# Patient Record
Sex: Male | Born: 2000 | Race: White | Hispanic: No | Marital: Single | Smoking: Current every day smoker
Health system: Southern US, Community
[De-identification: ages and names within clinical notes are randomized; demographics above are authoritative.]

---
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
## 2019-06-06 ENCOUNTER — Emergency Department (HOSPITAL_COMMUNITY)
Admission: EM | Admit: 2019-06-06 | Discharge: 2019-06-06 | Discharge status: Against Medical Advice | Payer: Medicaid Other

## 2019-06-06 NOTE — ED Notes (Signed)
PT is sitting with another patient who has been waiting for sometime now.

## 2019-06-06 NOTE — ED Notes (Signed)
PT seen leaqving with another patient who stated they are leaving

## 2019-06-16 ENCOUNTER — Encounter: Payer: Self-pay | Admitting: Emergency Medicine

## 2019-06-16 ENCOUNTER — Other Ambulatory Visit: Payer: Self-pay

## 2019-06-16 ENCOUNTER — Emergency Department
Admission: EM | Admit: 2019-06-16 | Discharge: 2019-06-16 | Disposition: A | Discharge status: Home / Self Care | Payer: Medicaid Other | Attending: Emergency Medicine | Admitting: Emergency Medicine

## 2019-06-16 DIAGNOSIS — F17200 Nicotine dependence, unspecified, uncomplicated: Secondary | ICD-10-CM | POA: Insufficient documentation

## 2019-06-16 DIAGNOSIS — F909 Attention-deficit hyperactivity disorder, unspecified type: Secondary | ICD-10-CM | POA: Diagnosis not present

## 2019-06-16 DIAGNOSIS — Z202 Contact with and (suspected) exposure to infections with a predominantly sexual mode of transmission: Secondary | ICD-10-CM | POA: Insufficient documentation

## 2019-06-16 DIAGNOSIS — Z79899 Other long term (current) drug therapy: Secondary | ICD-10-CM | POA: Insufficient documentation

## 2019-06-16 MED ORDER — LIDOCAINE HCL (PF) 1 % IJ SOLN
2.1000 mL | Freq: Once | INTRAMUSCULAR | Status: DC
  Filled 2019-06-16: qty 5

## 2019-06-16 MED ORDER — AZITHROMYCIN 500 MG PO TABS
1000.0000 mg | ORAL_TABLET | Freq: Once | ORAL | Status: AC
  Administered 2019-06-16: 1000 mg via ORAL
  Filled 2019-06-16: qty 2

## 2019-06-16 MED ORDER — CEFTRIAXONE SODIUM 250 MG IJ SOLR
250.0000 mg | Freq: Once | INTRAMUSCULAR | Status: DC
  Filled 2019-06-16: qty 250
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
## 2019-06-16 NOTE — Discharge Instructions (Signed)
Follow up with the health department in about 2 weeks.  Return to the ER for symptoms of concern if unable to schedule an appointment.
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
## 2019-07-24 ENCOUNTER — Emergency Department: Payer: Medicaid Other

## 2019-07-24 ENCOUNTER — Emergency Department
Admission: EM | Admit: 2019-07-24 | Discharge: 2019-07-24 | Disposition: A | Discharge status: Home / Self Care | Payer: Medicaid Other | Attending: Student in an Organized Health Care Education/Training Program | Admitting: Student in an Organized Health Care Education/Training Program

## 2019-07-24 ENCOUNTER — Other Ambulatory Visit: Payer: Self-pay

## 2019-07-24 DIAGNOSIS — S0990XA Unspecified injury of head, initial encounter: Secondary | ICD-10-CM | POA: Insufficient documentation

## 2019-07-24 DIAGNOSIS — Y929 Unspecified place or not applicable: Secondary | ICD-10-CM | POA: Diagnosis not present

## 2019-07-24 DIAGNOSIS — M7918 Myalgia, other site: Secondary | ICD-10-CM | POA: Insufficient documentation

## 2019-07-24 DIAGNOSIS — W1839XA Other fall on same level, initial encounter: Secondary | ICD-10-CM | POA: Insufficient documentation

## 2019-07-24 DIAGNOSIS — Y939 Activity, unspecified: Secondary | ICD-10-CM | POA: Diagnosis not present

## 2019-07-24 DIAGNOSIS — S060X9A Concussion with loss of consciousness of unspecified duration, initial encounter: Secondary | ICD-10-CM | POA: Insufficient documentation

## 2019-07-24 DIAGNOSIS — F17210 Nicotine dependence, cigarettes, uncomplicated: Secondary | ICD-10-CM | POA: Insufficient documentation

## 2019-07-24 DIAGNOSIS — Z79899 Other long term (current) drug therapy: Secondary | ICD-10-CM | POA: Diagnosis not present

## 2019-07-24 DIAGNOSIS — Y999 Unspecified external cause status: Secondary | ICD-10-CM | POA: Diagnosis not present

## 2019-07-24 DIAGNOSIS — S0083XA Contusion of other part of head, initial encounter: Secondary | ICD-10-CM | POA: Insufficient documentation

## 2019-07-24 DIAGNOSIS — S0993XA Unspecified injury of face, initial encounter: Secondary | ICD-10-CM | POA: Diagnosis present

## 2019-07-24 MED ORDER — FLUORESCEIN SODIUM 1 MG OP STRP
1.0000 | ORAL_STRIP | Freq: Once | OPHTHALMIC | Status: DC
  Filled 2019-07-24: qty 1

## 2019-07-24 MED ORDER — HYDROCODONE-ACETAMINOPHEN 5-325 MG PO TABS: 1.0000 | ORAL_TABLET | ORAL | 0 refills | Status: DC | PRN

## 2019-07-24 MED ORDER — HYDROCODONE-ACETAMINOPHEN 5-325 MG PO TABS
1.0000 | ORAL_TABLET | Freq: Once | ORAL | Status: AC
  Administered 2019-07-24: 1 via ORAL
  Filled 2019-07-24: qty 1
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
## 2020-01-31 ENCOUNTER — Emergency Department (HOSPITAL_BASED_OUTPATIENT_CLINIC_OR_DEPARTMENT_OTHER)
Admission: EM | Admit: 2020-01-31 | Discharge: 2020-01-31 | Disposition: A | Discharge status: Home / Self Care | Payer: Medicaid Other | Attending: Emergency Medicine | Admitting: Emergency Medicine

## 2020-01-31 ENCOUNTER — Encounter (HOSPITAL_BASED_OUTPATIENT_CLINIC_OR_DEPARTMENT_OTHER): Payer: Self-pay

## 2020-01-31 ENCOUNTER — Other Ambulatory Visit: Payer: Self-pay

## 2020-01-31 DIAGNOSIS — Z202 Contact with and (suspected) exposure to infections with a predominantly sexual mode of transmission: Secondary | ICD-10-CM | POA: Diagnosis present

## 2020-01-31 DIAGNOSIS — F17210 Nicotine dependence, cigarettes, uncomplicated: Secondary | ICD-10-CM | POA: Diagnosis not present

## 2020-01-31 MED ORDER — DOXYCYCLINE HYCLATE 100 MG PO TABS
100.0000 mg | ORAL_TABLET | Freq: Once | ORAL | Status: AC
  Administered 2020-01-31: 13:00:00 100 mg via ORAL
  Filled 2020-01-31: qty 1

## 2020-01-31 MED ORDER — CEFTRIAXONE SODIUM 500 MG IJ SOLR
500.0000 mg | Freq: Once | INTRAMUSCULAR | Status: AC
  Administered 2020-01-31: 500 mg via INTRAMUSCULAR
  Filled 2020-01-31: qty 500

## 2020-01-31 MED ORDER — DOXYCYCLINE HYCLATE 100 MG PO CAPS: 100.0000 mg | ORAL_CAPSULE | Freq: Two times a day (BID) | ORAL | 0 refills | Status: AC

## 2020-01-31 NOTE — ED Provider Notes (Signed)
Emergency Department Provider Note   I have reviewed the triage vital signs and the nursing notes.   HISTORY  Chief Complaint Exposure to STD   HPI Oscar Downs is a 19 y.o. male with past medical history reviewed below presents to the emergency department after known exposure to sexually transmitted disease.  Patient states that he was exposed to gonorrhea after his partner tested positive.  They have been treated but he was told that he would need to be treated as well.  He does not want HIV or syphilis testing today although this was discussed and offered.  He is not experiencing urethral discharge, dysuria, or other discomfort.  No fevers.   Past Medical History:  Diagnosis Date  . Attention deficit disorder     There are no problems to display for this patient.   History reviewed. No pertinent surgical history.  Allergies Patient has no known allergies.  No family history on file.  Social History Social History   Tobacco Use  . Smoking status: Current Every Day Smoker    Types: Cigarettes  . Smokeless tobacco: Never Used  Substance Use Topics  . Alcohol use: Yes    Comment: occ  . Drug use: No    Review of Systems  Constitutional: No fever/chills Gastrointestinal: No abdominal pain.   Genitourinary: Negative for dysuria or urethral discharge.   ____________________________________________   PHYSICAL EXAM:  VITAL SIGNS: ED Triage Vitals  Enc Vitals Group     BP 01/31/20 1209 130/70     Pulse Rate 01/31/20 1209 78     Resp 01/31/20 1209 16     Temp 01/31/20 1209 98.1 F (36.7 C)     Temp Source 01/31/20 1209 Oral     SpO2 01/31/20 1209 100 %     Weight 01/31/20 1206 155 lb (70.3 kg)     Height 01/31/20 1206 5\' 6"  (1.676 m)   Constitutional: Alert and oriented. Well appearing and in no acute distress. Eyes: Conjunctivae are normal.  Head: Atraumatic. Nose: No congestion/rhinnorhea. Mouth/Throat: Mucous membranes are moist. Neck: No  stridor.  Cardiovascular: Normal rate, regular rhythm. Respiratory: Normal respiratory effort.  Gastrointestinal: No distention.  Musculoskeletal: No gross deformities of extremities. Neurologic:  Normal speech and language.  Skin:  Skin is warm, dry and intact. No rash noted.  ____________________________________________   LABS (all labs ordered are listed, but only abnormal results are displayed)  Labs Reviewed  GC/CHLAMYDIA PROBE AMP ([place]) NOT AT [facility]   ____________________________________________   PROCEDURES  Procedure(s) performed:   Procedures  None  ____________________________________________   INITIAL IMPRESSION / ASSESSMENT AND PLAN / ED COURSE  Pertinent labs & imaging results that were available during my care of the patient were reviewed by me and considered in my medical decision making (see chart for details).   Patient presents to the emergency department for evaluation of STD exposure.  He believes he was exposed to gonorrhea.  Plan for treatment of both gonorrhea and chlamydia.  Patient to provide a urine sample.  He is not experiencing dysuria, hesitancy, urgency.  No urethral discharge.  GU exam deferred.  Patient does not want HIV or syphilis testing which was encouraged here.  We will give Rocephin once and doxycycline for the next week.  Patient advised that until his antibiotics are complete he could pass infection to others and needs to remain abstinent or use barrier protection. Patient will give dirty urine for STD screening.    ____________________________________________  FINAL CLINICAL  IMPRESSION(S) / ED DIAGNOSES  Final diagnoses:  STD exposure     MEDICATIONS GIVEN DURING THIS VISIT:  Medications  cefTRIAXone (ROCEPHIN) injection 500 mg (has no administration in time range)  doxycycline (VIBRA-TABS) tablet 100 mg (has no administration in time range)     NEW OUTPATIENT MEDICATIONS STARTED DURING THIS VISIT:  New  Prescriptions   DOXYCYCLINE (VIBRAMYCIN) 100 MG CAPSULE    Take 1 capsule (100 mg total) by mouth 2 (two) times daily for 7 days.    Note:  This document was prepared using Dragon voice recognition software and may include unintentional dictation errors.  Alona Bene, MD, [facility] Emergency [facility]    Damyon Mullane, Arlyss Repress, MD 01/31/20 [phone]

## 2020-01-31 NOTE — ED Triage Notes (Signed)
Pt reports STD exposure-denies penile d/c and dysuria-NAD-steady gait 

## 2020-01-31 NOTE — Discharge Instructions (Addendum)
You have been seen today in the Emergency Department (ED) for exposure to a sexually transmitted disease. You have been treated with a one time dose medication but need to complete 1 week of antibiotics. Please have your partner tested for STD's and do not resume sexual activity until you and your partner have confirmed negative test results or have both been treated. Use condoms or avoid sex until you are fully treated and your partner(s) have been treated as well.   Please follow up with your doctor as soon as possible regarding today's ED visit and your symptoms.   Return to the ED if your pain worsens, you develop a fever, or for any other symptoms that concern you.

## 2020-02-03 LAB — GC/CHLAMYDIA PROBE AMP (~~LOC~~) NOT AT ARMC
Chlamydia: NEGATIVE
Comment: NEGATIVE
Comment: NORMAL
Neisseria Gonorrhea: NEGATIVE
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
## 2020-09-26 DIAGNOSIS — Z419 Encounter for procedure for purposes other than remedying health state, unspecified: Secondary | ICD-10-CM | POA: Diagnosis not present

## 2020-10-27 DIAGNOSIS — Z419 Encounter for procedure for purposes other than remedying health state, unspecified: Secondary | ICD-10-CM | POA: Diagnosis not present

## 2020-10-28 DIAGNOSIS — Z6823 Body mass index (BMI) 23.0-23.9, adult: Secondary | ICD-10-CM | POA: Diagnosis not present

## 2020-10-28 DIAGNOSIS — K0889 Other specified disorders of teeth and supporting structures: Secondary | ICD-10-CM | POA: Diagnosis not present

## 2020-11-24 DIAGNOSIS — Z419 Encounter for procedure for purposes other than remedying health state, unspecified: Secondary | ICD-10-CM | POA: Diagnosis not present

## 2020-12-25 DIAGNOSIS — Z419 Encounter for procedure for purposes other than remedying health state, unspecified: Secondary | ICD-10-CM | POA: Diagnosis not present

## 2021-01-24 DIAGNOSIS — Z419 Encounter for procedure for purposes other than remedying health state, unspecified: Secondary | ICD-10-CM | POA: Diagnosis not present

## 2021-02-24 DIAGNOSIS — Z419 Encounter for procedure for purposes other than remedying health state, unspecified: Secondary | ICD-10-CM | POA: Diagnosis not present

## 2021-03-26 DIAGNOSIS — Z419 Encounter for procedure for purposes other than remedying health state, unspecified: Secondary | ICD-10-CM | POA: Diagnosis not present

## 2021-04-26 DIAGNOSIS — Z419 Encounter for procedure for purposes other than remedying health state, unspecified: Secondary | ICD-10-CM | POA: Diagnosis not present

## 2021-05-27 DIAGNOSIS — Z419 Encounter for procedure for purposes other than remedying health state, unspecified: Secondary | ICD-10-CM | POA: Diagnosis not present

## 2021-06-26 DIAGNOSIS — Z419 Encounter for procedure for purposes other than remedying health state, unspecified: Secondary | ICD-10-CM | POA: Diagnosis not present

## 2021-07-27 DIAGNOSIS — Z419 Encounter for procedure for purposes other than remedying health state, unspecified: Secondary | ICD-10-CM | POA: Diagnosis not present

## 2021-08-26 DIAGNOSIS — Z419 Encounter for procedure for purposes other than remedying health state, unspecified: Secondary | ICD-10-CM | POA: Diagnosis not present

## 2021-09-26 DIAGNOSIS — Z419 Encounter for procedure for purposes other than remedying health state, unspecified: Secondary | ICD-10-CM | POA: Diagnosis not present

## 2021-10-27 DIAGNOSIS — Z419 Encounter for procedure for purposes other than remedying health state, unspecified: Secondary | ICD-10-CM | POA: Diagnosis not present

## 2021-11-24 DIAGNOSIS — Z419 Encounter for procedure for purposes other than remedying health state, unspecified: Secondary | ICD-10-CM | POA: Diagnosis not present

## 2021-12-25 DIAGNOSIS — Z419 Encounter for procedure for purposes other than remedying health state, unspecified: Secondary | ICD-10-CM | POA: Diagnosis not present

## 2022-01-24 DIAGNOSIS — Z419 Encounter for procedure for purposes other than remedying health state, unspecified: Secondary | ICD-10-CM | POA: Diagnosis not present

## 2022-02-24 DIAGNOSIS — Z419 Encounter for procedure for purposes other than remedying health state, unspecified: Secondary | ICD-10-CM | POA: Diagnosis not present

## 2022-03-26 DIAGNOSIS — Z419 Encounter for procedure for purposes other than remedying health state, unspecified: Secondary | ICD-10-CM | POA: Diagnosis not present

## 2022-04-26 DIAGNOSIS — Z419 Encounter for procedure for purposes other than remedying health state, unspecified: Secondary | ICD-10-CM | POA: Diagnosis not present

## 2022-05-27 DIAGNOSIS — Z419 Encounter for procedure for purposes other than remedying health state, unspecified: Secondary | ICD-10-CM | POA: Diagnosis not present

## 2022-06-26 DIAGNOSIS — Z419 Encounter for procedure for purposes other than remedying health state, unspecified: Secondary | ICD-10-CM | POA: Diagnosis not present

## 2022-07-27 DIAGNOSIS — Z419 Encounter for procedure for purposes other than remedying health state, unspecified: Secondary | ICD-10-CM | POA: Diagnosis not present

## 2022-08-26 DIAGNOSIS — Z419 Encounter for procedure for purposes other than remedying health state, unspecified: Secondary | ICD-10-CM | POA: Diagnosis not present

## 2022-09-26 DIAGNOSIS — Z419 Encounter for procedure for purposes other than remedying health state, unspecified: Secondary | ICD-10-CM | POA: Diagnosis not present

## 2022-10-27 DIAGNOSIS — Z419 Encounter for procedure for purposes other than remedying health state, unspecified: Secondary | ICD-10-CM | POA: Diagnosis not present

## 2022-11-25 DIAGNOSIS — Z419 Encounter for procedure for purposes other than remedying health state, unspecified: Secondary | ICD-10-CM | POA: Diagnosis not present

## 2022-12-26 DIAGNOSIS — Z419 Encounter for procedure for purposes other than remedying health state, unspecified: Secondary | ICD-10-CM | POA: Diagnosis not present

## 2023-01-25 DIAGNOSIS — Z419 Encounter for procedure for purposes other than remedying health state, unspecified: Secondary | ICD-10-CM | POA: Diagnosis not present

## 2023-02-05 ENCOUNTER — Encounter: Payer: Self-pay | Admitting: Emergency Medicine

## 2023-02-05 ENCOUNTER — Other Ambulatory Visit: Payer: Self-pay

## 2023-02-05 ENCOUNTER — Emergency Department: Payer: Medicaid Other

## 2023-02-05 ENCOUNTER — Emergency Department
Admission: EM | Admit: 2023-02-05 | Discharge: 2023-02-05 | Disposition: A | Discharge status: Home / Self Care | Payer: Medicaid Other | Attending: Emergency Medicine | Admitting: Emergency Medicine

## 2023-02-05 DIAGNOSIS — M25511 Pain in right shoulder: Secondary | ICD-10-CM | POA: Insufficient documentation

## 2023-02-05 DIAGNOSIS — W1830XA Fall on same level, unspecified, initial encounter: Secondary | ICD-10-CM | POA: Diagnosis not present

## 2023-02-05 DIAGNOSIS — Z043 Encounter for examination and observation following other accident: Secondary | ICD-10-CM | POA: Diagnosis not present

## 2023-02-05 MED ORDER — ONDANSETRON 4 MG PO TBDP
4.0000 mg | ORAL_TABLET | Freq: Once | ORAL | Status: AC
  Administered 2023-02-05: 4 mg via ORAL
  Filled 2023-02-05: qty 1

## 2023-02-05 MED ORDER — MELOXICAM 15 MG PO TABS: 15.0000 mg | ORAL_TABLET | Freq: Every day | ORAL | 0 refills | Status: AC

## 2023-02-05 MED ORDER — HYDROCODONE-ACETAMINOPHEN 5-325 MG PO TABS
1.0000 | ORAL_TABLET | Freq: Once | ORAL | Status: AC
  Administered 2023-02-05: 1 via ORAL
  Filled 2023-02-05: qty 1

## 2023-02-05 MED ORDER — HYDROCODONE-ACETAMINOPHEN 5-325 MG PO TABS: 1.0000 | ORAL_TABLET | Freq: Four times a day (QID) | ORAL | 0 refills | Status: AC | PRN

## 2023-02-05 NOTE — ED Triage Notes (Signed)
Pt was playing with children and fell onto right shoulder. Pt complains of shoulder and collarbone pain.

## 2023-02-05 NOTE — Discharge Instructions (Signed)
Follow up with the orthopedist if not improving over the week.

## 2023-02-05 NOTE — ED Provider Triage Note (Signed)
Emergency Medicine Provider Triage Evaluation Note  Oscar Downs , a 22 y.o. male  was evaluated in triage.  Pt complains of right shoulder pain. He was playing with his children and fell. Pain in right upper shoulder and clavicle.  Physical Exam  Wt 70.3 kg   BMI 25.01 kg/m  Gen:   Awake, no distress   Resp:  Normal effort  MSK:   No obvious deformity of right shoulder. Other:    Medical Decision Making  Medically screening exam initiated at 6:11 PM.  Appropriate orders placed.  Oscar Downs was informed that the remainder of the evaluation will be completed by another provider, this initial triage assessment does not replace that evaluation, and the importance of remaining in the ED until their evaluation is complete.     Oscar Pester, Oscar Downs 02/05/23 1813

## 2023-02-05 NOTE — ED Provider Notes (Signed)
[facility] Provider Note    Event Date/Time   First MD Initiated Contact with Patient 02/05/23 2001     (approximate)   History   Shoulder Injury and Nausea   HPI  Oscar Downs is a 22 y.o. male with no significant past medical history and as listed in EMR presents to the emergency department for treatment and evaluation after injuring his right shoulder.  He states he was outside playing with his children fell and rolled on the elbow.  He now has pain in the clavicle area and superior aspect of the right shoulder.  No previous dislocations.  No alleviating measures attempted prior to arrival..      Physical Exam   Triage Vital Signs: ED Triage Vitals  Enc Vitals Group     BP 02/05/23 1814 129/74     Pulse Rate 02/05/23 1812 96     Resp 02/05/23 1812 16     Temp 02/05/23 1812 98.4 F (36.9 C)     Temp src --      SpO2 02/05/23 1812 99 %     Weight 02/05/23 1808 154 lb 15.7 oz (70.3 kg)     Height --      Head Circumference --      Peak Flow --      Pain Score 02/05/23 1808 8     Pain Loc --      Pain Edu? --      Excl. in GC? --     Most recent vital signs: Vitals:   02/05/23 1812 02/05/23 1814  BP:  129/74  Pulse: 96   Resp: 16   Temp: 98.4 F (36.9 C)   SpO2: 99%     General: Awake, no distress.  CV:  Good peripheral perfusion.  Resp:  Normal effort.  Abd:  No distention.  Other:  No step-off deformity of the right shoulder.  No obvious deformity of the clavicle on the right side.   ED Results / Procedures / Treatments   Labs (all labs ordered are listed, but only abnormal results are displayed) Labs Reviewed - No data to display   EKG  Not indicated   RADIOLOGY  Image and radiology report reviewed and interpreted by me. Radiology report consistent with the same.  Image of the right shoulder negative for acute concerns.  Clavicle is normal as well.  PROCEDURES:  Critical Care performed:  No  Procedures   MEDICATIONS ORDERED IN ED:  Medications  HYDROcodone-acetaminophen (NORCO/VICODIN) 5-325 MG per tablet 1 tablet (has no administration in time range)  ondansetron (ZOFRAN-ODT) disintegrating tablet 4 mg (4 mg Oral Given 02/05/23 1815)     IMPRESSION / MDM / ASSESSMENT AND PLAN / ED COURSE   I have reviewed the triage note.  Differential diagnosis includes, but is not limited to, shoulder strain, humerus fracture, clavicle fracture  Patient's presentation is most consistent with acute complicated illness / injury requiring diagnostic workup.  22 year old male presenting to the emergency department for treatment and evaluation after injuring his right shoulder while playing outside with his children.  See HPI for further details.  On exam, he has no step-off deformity noted.  No clavicle deformity is appreciated.  X-ray is consistent with exam.  There is no bony abnormality.  Plan will be to put him in a shoulder sling and have him follow-up with orthopedics if not improving over the week.  He will be given a short course of pain medication and anti-inflammatory as  well.      FINAL CLINICAL IMPRESSION(S) / ED DIAGNOSES   Final diagnoses:  Acute pain of right shoulder     Rx / DC Orders   ED Discharge Orders          Ordered    HYDROcodone-acetaminophen (NORCO/VICODIN) 5-325 MG tablet  Every 6 hours PRN        02/05/23 2014    meloxicam (MOBIC) 15 MG tablet  Daily        02/05/23 2014             Note:  This document was prepared using Dragon voice recognition software and may include unintentional dictation errors.   Chinita Pester, FNP 02/05/23 2021    Phineas Semen, MD 02/05/23 2044

## 2023-02-11 DIAGNOSIS — M25511 Pain in right shoulder: Secondary | ICD-10-CM | POA: Diagnosis not present

## 2023-02-11 DIAGNOSIS — S4990XA Unspecified injury of shoulder and upper arm, unspecified arm, initial encounter: Secondary | ICD-10-CM | POA: Diagnosis not present

## 2023-02-11 DIAGNOSIS — Z79899 Other long term (current) drug therapy: Secondary | ICD-10-CM | POA: Diagnosis not present

## 2023-02-17 DIAGNOSIS — Z79899 Other long term (current) drug therapy: Secondary | ICD-10-CM | POA: Diagnosis not present

## 2023-02-25 DIAGNOSIS — Z419 Encounter for procedure for purposes other than remedying health state, unspecified: Secondary | ICD-10-CM | POA: Diagnosis not present

## 2023-03-27 DIAGNOSIS — Z419 Encounter for procedure for purposes other than remedying health state, unspecified: Secondary | ICD-10-CM | POA: Diagnosis not present

## 2023-04-27 DIAGNOSIS — Z419 Encounter for procedure for purposes other than remedying health state, unspecified: Secondary | ICD-10-CM | POA: Diagnosis not present

## 2023-05-28 DIAGNOSIS — Z419 Encounter for procedure for purposes other than remedying health state, unspecified: Secondary | ICD-10-CM | POA: Diagnosis not present

## 2023-06-27 DIAGNOSIS — Z419 Encounter for procedure for purposes other than remedying health state, unspecified: Secondary | ICD-10-CM | POA: Diagnosis not present

## 2023-07-28 DIAGNOSIS — Z419 Encounter for procedure for purposes other than remedying health state, unspecified: Secondary | ICD-10-CM | POA: Diagnosis not present

## 2023-08-27 DIAGNOSIS — Z419 Encounter for procedure for purposes other than remedying health state, unspecified: Secondary | ICD-10-CM | POA: Diagnosis not present

## 2023-09-27 DIAGNOSIS — Z419 Encounter for procedure for purposes other than remedying health state, unspecified: Secondary | ICD-10-CM | POA: Diagnosis not present

## 2023-10-28 DIAGNOSIS — Z419 Encounter for procedure for purposes other than remedying health state, unspecified: Secondary | ICD-10-CM | POA: Diagnosis not present

## 2023-11-25 DIAGNOSIS — Z419 Encounter for procedure for purposes other than remedying health state, unspecified: Secondary | ICD-10-CM | POA: Diagnosis not present

## 2024-01-06 DIAGNOSIS — Z419 Encounter for procedure for purposes other than remedying health state, unspecified: Secondary | ICD-10-CM | POA: Diagnosis not present

## 2024-02-05 DIAGNOSIS — Z419 Encounter for procedure for purposes other than remedying health state, unspecified: Secondary | ICD-10-CM | POA: Diagnosis not present

## 2024-03-07 DIAGNOSIS — Z419 Encounter for procedure for purposes other than remedying health state, unspecified: Secondary | ICD-10-CM | POA: Diagnosis not present

## 2024-04-06 DIAGNOSIS — Z419 Encounter for procedure for purposes other than remedying health state, unspecified: Secondary | ICD-10-CM | POA: Diagnosis not present

## 2024-05-07 DIAGNOSIS — Z419 Encounter for procedure for purposes other than remedying health state, unspecified: Secondary | ICD-10-CM | POA: Diagnosis not present

## 2024-06-07 DIAGNOSIS — Z419 Encounter for procedure for purposes other than remedying health state, unspecified: Secondary | ICD-10-CM | POA: Diagnosis not present
